# Patient Record
Sex: Female | Born: 1960 | Race: White | Hispanic: No | Marital: Married | Smoking: Current some day smoker
Health system: Southern US, Community
[De-identification: ages and names within clinical notes are randomized; demographics above are authoritative.]

## PROBLEM LIST (undated history)

## (undated) DIAGNOSIS — D500 Iron deficiency anemia secondary to blood loss (chronic): Secondary | ICD-10-CM

## (undated) DIAGNOSIS — M797 Fibromyalgia: Secondary | ICD-10-CM

## (undated) DIAGNOSIS — R5382 Chronic fatigue, unspecified: Secondary | ICD-10-CM

## (undated) DIAGNOSIS — K649 Unspecified hemorrhoids: Secondary | ICD-10-CM

## (undated) HISTORY — PX: BACK SURGERY: SHX140

## (undated) HISTORY — PX: BREAST SURGERY: SHX581

---

## 1999-01-08 ENCOUNTER — Encounter: Admission: RE | Admit: 1999-01-08 | Discharge: 1999-01-08 | Payer: Self-pay | Admitting: Hematology and Oncology

## 1999-02-26 ENCOUNTER — Encounter: Admission: RE | Admit: 1999-02-26 | Discharge: 1999-02-26 | Payer: Self-pay | Admitting: Internal Medicine

## 1999-04-03 ENCOUNTER — Encounter: Admission: RE | Admit: 1999-04-03 | Discharge: 1999-04-03 | Payer: Self-pay | Admitting: Internal Medicine

## 1999-07-01 ENCOUNTER — Encounter: Admission: RE | Admit: 1999-07-01 | Discharge: 1999-07-01 | Payer: Self-pay | Admitting: Hematology and Oncology

## 1999-07-31 ENCOUNTER — Encounter: Admission: RE | Admit: 1999-07-31 | Discharge: 1999-07-31 | Payer: Self-pay | Admitting: Internal Medicine

## 1999-09-09 ENCOUNTER — Encounter: Admission: RE | Admit: 1999-09-09 | Discharge: 1999-09-09 | Payer: Self-pay | Admitting: Obstetrics

## 1999-09-09 ENCOUNTER — Other Ambulatory Visit: Admission: RE | Admit: 1999-09-09 | Discharge: 1999-09-09 | Payer: Self-pay | Admitting: Obstetrics

## 1999-09-13 ENCOUNTER — Emergency Department (HOSPITAL_COMMUNITY): Admission: EM | Admit: 1999-09-13 | Discharge: 1999-09-13 | Payer: Self-pay | Admitting: Emergency Medicine

## 1999-09-26 ENCOUNTER — Encounter: Admission: RE | Admit: 1999-09-26 | Discharge: 1999-12-25 | Payer: Self-pay | Admitting: Sports Medicine

## 2000-04-13 ENCOUNTER — Encounter: Admission: RE | Admit: 2000-04-13 | Discharge: 2000-04-13 | Payer: Self-pay | Admitting: Obstetrics & Gynecology

## 2000-04-15 ENCOUNTER — Ambulatory Visit (HOSPITAL_COMMUNITY): Admission: RE | Admit: 2000-04-15 | Discharge: 2000-04-15 | Payer: Self-pay | Admitting: Obstetrics & Gynecology

## 2000-04-15 ENCOUNTER — Encounter: Payer: Self-pay | Admitting: Obstetrics & Gynecology

## 2000-05-07 ENCOUNTER — Encounter: Admission: RE | Admit: 2000-05-07 | Discharge: 2000-05-07 | Payer: Self-pay | Admitting: Obstetrics & Gynecology

## 2000-05-20 ENCOUNTER — Encounter: Admission: RE | Admit: 2000-05-20 | Discharge: 2000-05-20 | Payer: Self-pay | Admitting: Obstetrics

## 2000-07-19 ENCOUNTER — Encounter: Admission: RE | Admit: 2000-07-19 | Discharge: 2000-07-19 | Payer: Self-pay | Admitting: Internal Medicine

## 2000-08-20 ENCOUNTER — Encounter: Admission: RE | Admit: 2000-08-20 | Discharge: 2000-08-20 | Payer: Self-pay | Admitting: Obstetrics & Gynecology

## 2000-08-24 ENCOUNTER — Encounter: Admission: RE | Admit: 2000-08-24 | Discharge: 2000-08-24 | Payer: Self-pay | Admitting: Obstetrics & Gynecology

## 2000-10-06 ENCOUNTER — Encounter: Admission: RE | Admit: 2000-10-06 | Discharge: 2000-10-06 | Payer: Self-pay | Admitting: Obstetrics & Gynecology

## 2000-10-06 ENCOUNTER — Other Ambulatory Visit: Admission: RE | Admit: 2000-10-06 | Discharge: 2000-10-06 | Payer: Self-pay | Admitting: Obstetrics & Gynecology

## 2000-10-07 ENCOUNTER — Encounter (INDEPENDENT_AMBULATORY_CARE_PROVIDER_SITE_OTHER): Payer: Self-pay

## 2000-10-07 ENCOUNTER — Inpatient Hospital Stay (HOSPITAL_COMMUNITY): Admission: RE | Admit: 2000-10-07 | Discharge: 2000-10-08 | Payer: Self-pay | Admitting: *Deleted

## 2000-10-12 ENCOUNTER — Encounter: Admission: RE | Admit: 2000-10-12 | Discharge: 2000-10-12 | Payer: Self-pay | Admitting: Obstetrics & Gynecology

## 2001-11-01 ENCOUNTER — Encounter: Admission: RE | Admit: 2001-11-01 | Discharge: 2001-11-01 | Payer: Self-pay | Admitting: Internal Medicine

## 2008-11-13 ENCOUNTER — Emergency Department (HOSPITAL_BASED_OUTPATIENT_CLINIC_OR_DEPARTMENT_OTHER): Admission: EM | Admit: 2008-11-13 | Discharge: 2008-11-14 | Payer: Self-pay | Admitting: Emergency Medicine

## 2011-03-12 ENCOUNTER — Emergency Department (INDEPENDENT_AMBULATORY_CARE_PROVIDER_SITE_OTHER): Payer: Medicare Other

## 2011-03-12 ENCOUNTER — Emergency Department (HOSPITAL_BASED_OUTPATIENT_CLINIC_OR_DEPARTMENT_OTHER)
Admission: EM | Admit: 2011-03-12 | Discharge: 2011-03-12 | Disposition: A | Discharge status: Home / Self Care | Payer: Medicare Other | Attending: Emergency Medicine | Admitting: Emergency Medicine

## 2011-03-12 DIAGNOSIS — S2239XA Fracture of one rib, unspecified side, initial encounter for closed fracture: Secondary | ICD-10-CM

## 2011-03-12 DIAGNOSIS — Z79899 Other long term (current) drug therapy: Secondary | ICD-10-CM | POA: Insufficient documentation

## 2011-03-12 DIAGNOSIS — F17200 Nicotine dependence, unspecified, uncomplicated: Secondary | ICD-10-CM | POA: Insufficient documentation

## 2011-03-12 DIAGNOSIS — IMO0001 Reserved for inherently not codable concepts without codable children: Secondary | ICD-10-CM | POA: Insufficient documentation

## 2011-03-12 DIAGNOSIS — IMO0002 Reserved for concepts with insufficient information to code with codable children: Secondary | ICD-10-CM | POA: Insufficient documentation

## 2011-03-12 DIAGNOSIS — R5382 Chronic fatigue, unspecified: Secondary | ICD-10-CM | POA: Insufficient documentation

## 2011-03-12 DIAGNOSIS — I10 Essential (primary) hypertension: Secondary | ICD-10-CM | POA: Insufficient documentation

## 2011-03-12 DIAGNOSIS — M533 Sacrococcygeal disorders, not elsewhere classified: Secondary | ICD-10-CM

## 2011-03-12 DIAGNOSIS — G9332 Myalgic encephalomyelitis/chronic fatigue syndrome: Secondary | ICD-10-CM | POA: Insufficient documentation

## 2011-05-08 NOTE — Discharge Summary (Signed)
[facility]  Patient:    Emily Oliver, Emily Oliver                        MRN: [id number] Adm. Date:  [id number] Disc. Date: [id number] Attending:  Donne Hazel CC:         [facility]   Discharge Summary  HISTORY & PHYSICAL:           Please see the dictated History & Physical examination.  In brief, the patient is a 50 year old, para 2, who presents for a total vaginal hysterectomy for dysfunctional uterine bleeding refractory to medical management.  HOSPITAL COURSE:              The patient underwent a total vaginal hysterectomy.  Please see the dictated operative summary for further details. The patients postoperative course was uneventful, and she is discharged to home on postoperative day #1.  DISCHARGE DIAGNOSIS:          Dysfunction uterine bleeding.  PROCEDURES:                   Total vaginal hysterectomy.  CONDITION UPON DISCHARGE:     Good.  DIET:                         Regular.  ACTIVITY:                     No strenuous activity, no intercourse.  DISCHARGE MEDICATIONS:        Motrin and Percocet.  DISPOSITION:                  The patient is to return to GYN clinic on Tuesday, October 23, for followup. DD:  11/01/00 TD:  11/01/00 Job: [id number]

## 2011-05-08 NOTE — H&P (Signed)
[facility]  Patient:    Emily Oliver, Emily Oliver                          MRN: [id number] Adm. Date:  08/24/00 Attending:  Roseanna Rainbow, M.D.                         History and Physical  CHIEF COMPLAINT:              The patient is a 50 year old, para 2, with dysfunction uterine bleeding refractory to medical management who presents for a total vaginal hysterectomy.  HISTORY OF PRESENT ILLNESS:   The patient has a long history of menorrhagia. There have been several attempts at medical management; i.e. cyclic progestins; however, the patients abnormal uterine bleeding persisted. Workup to this point has included a prolactin and TSH that were within normal limits.  An endometrial biopsy performed in September 2000 demonstrated benign endometrial fragments with features suggestive of exogenous hormonal effects. An ultrasound from April 2001 demonstrated a normal-size uterus.  The left ovary was not identified; the right ovary was consistent with a ruptured follicular cyst.  ALLERGIES:                    CODEINE.  MEDICATIONS:                  Hydrochlorothiazide, Altace, Zoloft, Dexedrine, and Claritin.  PAST OBSTETRICAL/GYNECOLOGIC HISTORY:  She is status post two NSVDs.  She denies any history of any sexually transmitted diseases.  Pap smear from April 2001 demonstrated inflammatory changes.  PAST MEDICAL HISTORY:         ADHD, hypertension, fibromyalgia, allergic rhinitis.  PAST SURGICAL HISTORY:        Breast augmentation.  FAMILY HISTORY:               Hypertension and depression.  SOCIAL HISTORY:               Tobacco, no ethanol or substance abuse.  PHYSICAL EXAMINATION:  VITAL SIGNS:                  Pulse 108, blood pressure 144/94, weight 150.4 pounds, height 5 feet 7 inches.  GENERAL:                      Well-developed, well-nourished, no apparent distress.  HEENT:                        Normocephalic, atraumatic.  NECK:                          No thyromegaly, supple.  LUNGS:                        Clear to auscultation bilaterally.  HEART:                        Regular rate and rhythm.  BREASTS:                      Nontender, no discharge.  ABDOMEN:                      Normoactive bowel sounds throughout, no organomegaly.  PELVIC:  On speculum exam, the vagina is clean.  On bimanual exam, the uterus is small, anteverted, nontender.  The adnexa are nonpalpable and nontender.  Rectovaginal exam is confirmatory.  EXTREMITIES:                  No clubbing, cyanosis, or edema.  ASSESSMENT:                   1. Dysfunction uterine bleeding refractory to                                  medical management.                               2. Pap smear from March 2001 shows inflammatory                                  changes.  PLAN:                         Will repeat the Pap smear.  Will also screen for any bleeding diaphysis; however, this is unlikely.  The patient will be a candidate for a total vaginal hysterectomy. DD:  08/24/00 TD:  08/24/00 Job: [id number]

## 2011-05-08 NOTE — Op Note (Signed)
[facility]  Patient:    Emily Oliver, Emily Oliver                        MRN: [id number] Proc. Date: 10/07/00 Adm. Date:  [id number] Disc. Date: [id number] Attending:  Donne Hazel CC:         [facility]   Operative Report  PREOPERATIVE DIAGNOSIS:       Dysfunctional uterine bleeding.  POSTOPERATIVE DIAGNOSIS:      Dysfunctional uterine bleeding.  OPERATION:                    Total vaginal hysterectomy.  SURGEON:                      Roseanna Rainbow, M.D.  ASSISTANT:                    Donney Rankins, M.D.  ANESTHESIA:                   General endotracheal.  COMPLICATIONS:                None.  ESTIMATED BLOOD LOSS:         100 cc.  URINE OUTPUT:                 200 cc of clear urine at the end of the                               procedure.  FLUIDS:                       500 cc of lactated Ringers.  FINDINGS:                     Exam under anesthesia - anteverted uterus, upper normal limits in size, regular contour.  Operative findings of small 7 x 6, regularly shaped uterus.  Normal tubes and ovaries not well-visualized.  DESCRIPTION OF PROCEDURE:     The risks, benefits, indications, and alternatives of the procedure were reviewed with the patient and informed consent was obtained.  The patient was taken to the operating room with an IV running.  The patient was placed in the dorsolithotomy position, and prepped and draped in the usual sterile fashion.  A weighted speculum was then placed into the vagina and the cervix grasped with a Jacobs tenaculum.  The cervix was then injected anteriorly with 1% lidocaine with 1:200,000 of epinephrine. The cervix was then incised with the scalpel anteriorly and the bladder dissected off the pubovesical cervical fascia anteriorly with Metzenbaum scissors.  The anterior cul-de-sac was then entered sharply.  The same procedure was then performed posteriorly and the  posterior cul-de-sac entered sharply without difficulty.                                At this point, Heaney clamps were placed over the uterosacral ligaments on either side.  These were then transected, and free ties and suture ligatures were placed using 0 chromic.  Hemostasis was assured.  The cardinal ligaments were then clamped on both sides, transected, and secured in a similar fashion.  The uterine arteries were then serially clamped with Heaney clamps, transected.  Free ligatures and  suture ligatures were placed on both sides.  Excellent hemostasis was visualized.                                At this point, the fundus was delivered by applying traction to the fundus with tenaculum and pushing the cervix into the pelvis.  The cornua on both sides were clamped with Heaney clamps, transected, and both free ligatures and suture ligatures were placed.  Excellent hemostasis was noted.  The broad ligament was then serially clamped with Heaney clamps, transected, and both free ligatures and suture ligatures were placed on both sides.  The remainder of the cardinal complex was then clamped with Heaney clamps on both sides and both free ligatures, and suture ligatures were placed, and the uterus and cervix were removed.  The vaginal cuff angles were closed with figure-of-eight sutures of 0 chromic.  The posterior aspect of the vaginal cuff was whip stitched with interrupted figure-of-eight sutures for hemostasis.  The remainder of the vaginal cuff was closed with figure-of-eight sutures of 0 chromic in an interrupted fashion.                                All instruments were then removed from the vagina.  The Foley catheter was placed, and the patient taken out of the dorsolithotomy position, and awakened from general anesthesia.  She was taken to the PACU in stable condition.  Sponge, lap, needle, and instrument counts were correct x 2.  PATHOLOGY:                    Uterus and  cervix. DD:  10/10/00 TD:  10/11/00 Job: [id number]

## 2011-08-06 IMAGING — CR DG RIBS 2V*L*
2 series · 2 of 2 positions shown · non-contrast
Comparison: None.

CLINICAL DATA: Trauma.  Low anterior left sided chest pain times 1
month.

LEFT RIBS - 2 VIEW

[w ribs ap/pa upper left]
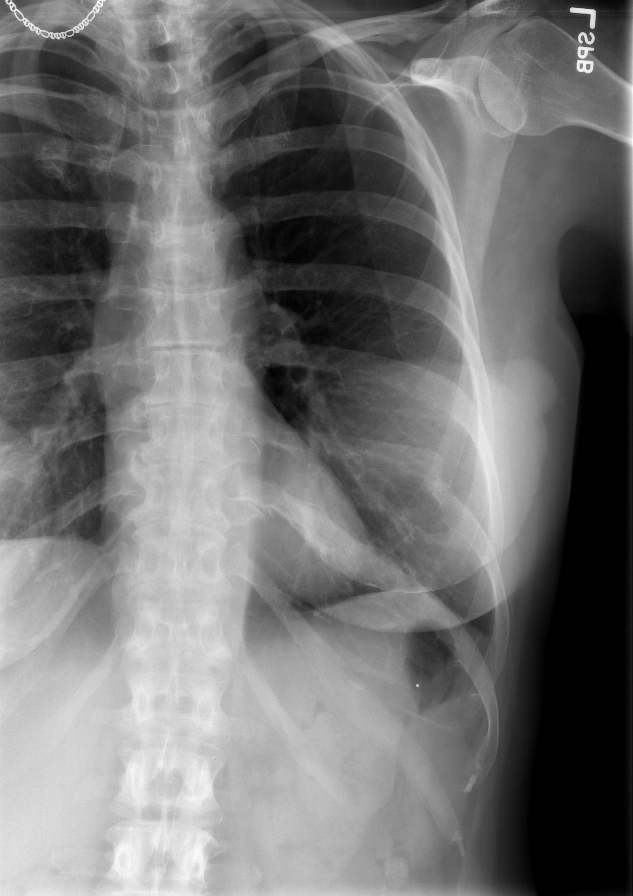

[w ribs ap/pa lower left]
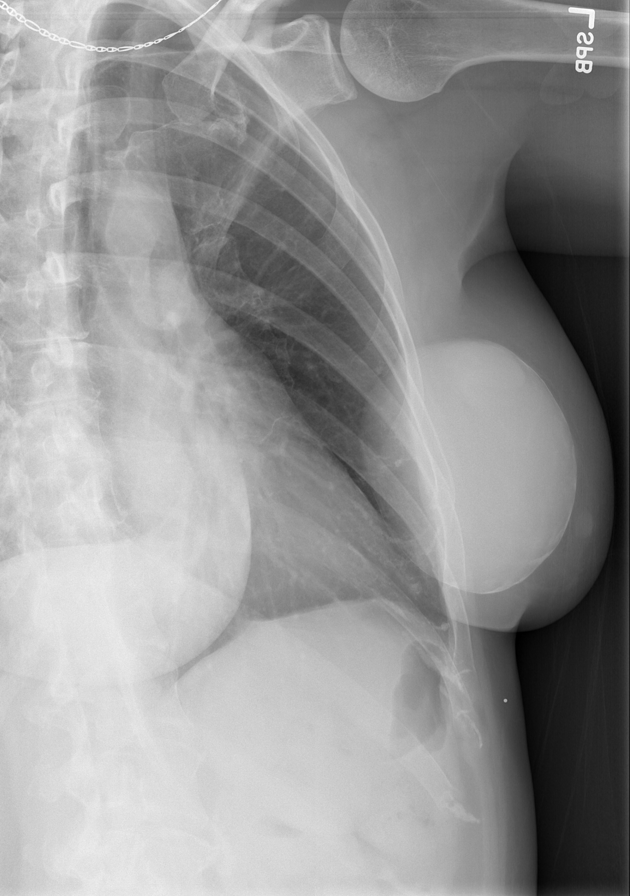

[2 of 2 positions shown; findings below may reference images not displayed]

FINDINGS: Two views of the left ribs are submitted.  A displaced
posterior left ninth rib fracture is evident.  There is no definite
pneumothorax.  A standard chest x-ray would be useful for better
evaluation of pneumothorax.  Heart size is normal.  A breast
implant is noted.
IMPRESSION: 1.  Displaced posterior left ninth rib fracture.
2.  No definite pneumothorax.

## 2018-05-25 ENCOUNTER — Emergency Department (HOSPITAL_BASED_OUTPATIENT_CLINIC_OR_DEPARTMENT_OTHER)
Admission: EM | Admit: 2018-05-25 | Discharge: 2018-05-25 | Disposition: A | Discharge status: Home / Self Care | Payer: BLUE CROSS/BLUE SHIELD | Attending: Emergency Medicine | Admitting: Emergency Medicine

## 2018-05-25 ENCOUNTER — Other Ambulatory Visit: Payer: Self-pay

## 2018-05-25 ENCOUNTER — Encounter (HOSPITAL_BASED_OUTPATIENT_CLINIC_OR_DEPARTMENT_OTHER): Payer: Self-pay

## 2018-05-25 DIAGNOSIS — F17290 Nicotine dependence, other tobacco product, uncomplicated: Secondary | ICD-10-CM | POA: Diagnosis not present

## 2018-05-25 DIAGNOSIS — K625 Hemorrhage of anus and rectum: Secondary | ICD-10-CM | POA: Diagnosis not present

## 2018-05-25 DIAGNOSIS — E876 Hypokalemia: Secondary | ICD-10-CM | POA: Diagnosis not present

## 2018-05-25 DIAGNOSIS — D649 Anemia, unspecified: Secondary | ICD-10-CM | POA: Diagnosis not present

## 2018-05-25 HISTORY — DX: Unspecified hemorrhoids: K64.9

## 2018-05-25 HISTORY — DX: Chronic fatigue, unspecified: R53.82

## 2018-05-25 HISTORY — DX: Iron deficiency anemia secondary to blood loss (chronic): D50.0

## 2018-05-25 HISTORY — DX: Fibromyalgia: M79.7

## 2018-05-25 LAB — CBC WITH DIFFERENTIAL/PLATELET
BASOS ABS: 0.1 10*3/uL (ref 0.0–0.1)
Basophils Relative: 1 %
Eosinophils Absolute: 0.1 10*3/uL (ref 0.0–0.7)
Eosinophils Relative: 1 %
HCT: 25 % — ABNORMAL LOW (ref 36.0–46.0)
Hemoglobin: 7.3 g/dL — ABNORMAL LOW (ref 12.0–15.0)
LYMPHS ABS: 1.5 10*3/uL (ref 0.7–4.0)
Lymphocytes Relative: 16 %
MCH: 24.4 pg — ABNORMAL LOW (ref 26.0–34.0)
MCHC: 29.2 g/dL — AB (ref 30.0–36.0)
MCV: 83.6 fL (ref 78.0–100.0)
Monocytes Absolute: 1.2 10*3/uL — ABNORMAL HIGH (ref 0.1–1.0)
Monocytes Relative: 13 %
NEUTROS ABS: 6.5 10*3/uL (ref 1.7–7.7)
Neutrophils Relative %: 69 %
Platelets: 427 10*3/uL — ABNORMAL HIGH (ref 150–400)
RBC: 2.99 MIL/uL — ABNORMAL LOW (ref 3.87–5.11)
RDW: 22.5 % — AB (ref 11.5–15.5)
WBC: 9.4 10*3/uL (ref 4.0–10.5)

## 2018-05-25 LAB — BASIC METABOLIC PANEL
Anion gap: 10 (ref 5–15)
BUN: 8 mg/dL (ref 6–20)
CALCIUM: 8.9 mg/dL (ref 8.9–10.3)
CO2: 26 mmol/L (ref 22–32)
CREATININE: 0.58 mg/dL (ref 0.44–1.00)
Chloride: 101 mmol/L (ref 101–111)
GFR calc non Af Amer: >60 mL/min (ref >60)
Glucose, Bld: 101 mg/dL — ABNORMAL HIGH (ref 65–99)
Potassium: 2.9 mmol/L — ABNORMAL LOW (ref 3.5–5.1)
SODIUM: 137 mmol/L (ref 135–145)

## 2018-05-25 LAB — OCCULT BLOOD X 1 CARD TO LAB, STOOL: Fecal Occult Bld: POSITIVE — AB

## 2018-05-25 MED ORDER — POTASSIUM CHLORIDE ER 10 MEQ PO TBCR: 40.0000 meq | EXTENDED_RELEASE_TABLET | Freq: Every day | ORAL | 0 refills | Status: AC

## 2018-05-25 MED ORDER — POTASSIUM CHLORIDE CRYS ER 20 MEQ PO TBCR
40.0000 meq | EXTENDED_RELEASE_TABLET | Freq: Once | ORAL | Status: AC
  Administered 2018-05-25: 40 meq via ORAL
  Filled 2018-05-25: qty 2

## 2018-05-25 NOTE — ED Notes (Signed)
Pt understood dc material. NAD noted. Script given at dc  

## 2018-05-25 NOTE — ED Triage Notes (Signed)
Pt c/o rectal bleeding for the last four years, worse over the last month with dizziness, states her endoscopy doctor wanted her to come here and have blood work

## 2018-05-25 NOTE — Discharge Instructions (Addendum)
You were seen in the emergency department today for rectal bleeding and lightheadedness.  Your hemoglobin was 7.3- please continue to take your iron supplement pills and have this rechecked by your primary care provider in the next 1 to 2 days.  Additionally your potassium was low at 2.9, we have prescribed you potassium supplement please take this to ensure your potassium is replaced.  As discussed above follow-up with your primary care provider in the next 1 to 2 days for reevaluation.  Return to the ER immediately for new or worsening symptoms including but not limited to increased lightheadedness, passing out, chest pain, trouble breathing, or any other concerns that you may have.

## 2018-05-25 NOTE — ED Provider Notes (Signed)
[facility] EMERGENCY DEPARTMENT Provider Note   CSN: [id number] Arrival date & time: 05/25/18  1848     History   Chief Complaint Chief Complaint  Patient presents with  . Rectal Bleeding    HPI Emily Oliver is a 57 y.o. female with a hx of tobacco abuse, fibromyalgia, hemorrhoids, and iron deficiency anemia due to chronic blood loss who presents to the ED for increased rectal bleeding and intermittent lightheadedness over past 2 days. Patient states she has had rectal bleeding for the past 4 years- this has been worked up in the past. She had an endoscopy, colonoscopy, and capsule endoscopy 6 months ago- only abnormalities have been internal hemorrhoids. She takes an iron supplement. She states that she is followed by [facility], was seen at the beginning of May 2019 and had an hgb in the 7s. Patient states over the past 2 days she has had some increased bleeding, however this seemed to stop today. She gets abdominal pain prior to a BM, otherwise none. She states yesterday she felt lightheaded when transitioning from sitting to standing a few times, this has happened once today. Other than transitioning positions she is asymptomatic. No other specific alleviating/aggravating factors. Denies syncope, dyspnea, chest pain, dizziness like room spinning, nausea, vomiting, diarrhea, or melena. She called the gastroenterology group who will be doing a repeat endoscopy and they recommend she come to the ER for labs to be checked.    HPI  Past Medical History:  Diagnosis Date  . Chronic fatigue   . Fibromyalgia   . Hemorrhoids   . Iron deficiency anemia due to chronic blood loss     There are no active problems to display for this patient.   Past Surgical History:  Procedure Laterality Date  . BACK SURGERY     multiple  . BREAST SURGERY     implant     OB History   None      Home Medications    Prior to Admission medications   Not on File     Family History No family history on file.  Social History Social History   Tobacco Use  . Smoking status: Current Some Day Smoker    Types: E-cigarettes  . Smokeless tobacco: Never Used  Substance Use Topics  . Alcohol use: Not Currently    Comment: occasion  . Drug use: Not on file     Allergies   Patient has no known allergies.   Review of Systems Review of Systems  Constitutional: Negative for chills and fever.  Respiratory: Negative for shortness of breath.   Cardiovascular: Negative for chest pain.  Gastrointestinal: Positive for abdominal pain (just prior to BM, otherwise none) and blood in stool. Negative for constipation, diarrhea, nausea and vomiting.  Neurological: Positive for light-headedness (only with transitioning from sitting to standing). Negative for seizures, syncope, weakness and numbness.  All other systems reviewed and are negative.  Physical Exam Updated Vital Signs BP 118/81 (BP Location: Left Arm)   Pulse (!) 103   Temp 98.7 F (37.1 C) (Oral)   Resp 20   Ht 5\' 7"  (1.702 m)   Wt 67.1 kg (148 lb)   SpO2 100%   BMI 23.18 kg/m   Physical Exam  Constitutional: She appears well-developed and well-nourished. No distress.  HENT:  Head: Normocephalic and atraumatic.  Eyes: Conjunctivae are normal. Right eye exhibits no discharge. Left eye exhibits no discharge.  Cardiovascular: Regular rhythm. Tachycardia present.  No murmur heard. Pulmonary/Chest: Breath sounds normal. No respiratory distress. She has no wheezes. She has no rales.  Abdominal: Soft. Normal appearance. She exhibits no distension. There is no tenderness. There is no rebound and no guarding.  Genitourinary: Rectal exam shows external hemorrhoid (multiple, nontender, nonthrombosed) and guaiac positive stool (positive). Rectal exam shows no tenderness. Pelvic exam was performed with patient in the knee-chest position.  Genitourinary Comments: RN Susy FrizzleMatt present as chaperone Soft  brown stool on exam, no gross melena.   Neurological: She is alert.  Clear speech.   Skin: Skin is warm and dry. No rash noted.  Psychiatric: She has a normal mood and affect. Her behavior is normal.  Nursing note and vitals reviewed.  ED Treatments / Results  Labs Results for orders placed or performed during the hospital encounter of 05/25/18  CBC with Differential  Result Value Ref Range   WBC 9.4 4.0 - 10.5 K/uL   RBC 2.99 (L) 3.87 - 5.11 MIL/uL   Hemoglobin 7.3 (L) 12.0 - 15.0 g/dL   HCT 82.925.0 (L) 56.236.0 - 13.046.0 %   MCV 83.6 78.0 - 100.0 fL   MCH 24.4 (L) 26.0 - 34.0 pg   MCHC 29.2 (L) 30.0 - 36.0 g/dL   RDW 86.522.5 (H) 78.411.5 - 69.615.5 %   Platelets 427 (H) 150 - 400 K/uL   Neutrophils Relative % 69 %   Lymphocytes Relative 16 %   Monocytes Relative 13 %   Eosinophils Relative 1 %   Basophils Relative 1 %   Neutro Abs 6.5 1.7 - 7.7 K/uL   Lymphs Abs 1.5 0.7 - 4.0 K/uL   Monocytes Absolute 1.2 (H) 0.1 - 1.0 K/uL   Eosinophils Absolute 0.1 0.0 - 0.7 K/uL   Basophils Absolute 0.1 0.0 - 0.1 K/uL   RBC Morphology POLYCHROMASIA PRESENT   Basic metabolic panel  Result Value Ref Range   Sodium 137 135 - 145 mmol/L   Potassium 2.9 (L) 3.5 - 5.1 mmol/L   Chloride 101 101 - 111 mmol/L   CO2 26 22 - 32 mmol/L   Glucose, Bld 101 (H) 65 - 99 mg/dL   BUN 8 6 - 20 mg/dL   Creatinine, Ser 2.950.58 0.44 - 1.00 mg/dL   Calcium 8.9 8.9 - 28.410.3 mg/dL   GFR calc non Af Amer >60 >60 mL/min   GFR calc Af Amer >60 >60 mL/min   Anion gap 10 5 - 15  Occult blood card to lab, stool Provider will collect  Result Value Ref Range   Fecal Occult Bld POSITIVE (A) NEGATIVE   No results found.  EKG None  Radiology No results found.  Procedures Procedures (including critical care time)  Medications Ordered in ED Medications - No data to display   Initial Impression / Assessment and Plan / ED Course  I have reviewed the triage vital signs and the nursing notes.  Pertinent labs & imaging results  that were available during my care of the patient were reviewed by me and considered in my medical decision making (see chart for details).   Patient presents with rectal bleeding and lightheadedness.  Patient nontoxic-appearing, no apparent distress, vitals WNL with the exception of mild tachycardia.  Benign physical exam.  Rectal exam performed consistent with external hemorrhoids which appear nonthrombosed, there is no gross melena on exam.  Labs reviewed notable for hypokalemia at 2.9 and microcytic anemia with hgb 7.3- patient states this was what her hgb was at her recent PCP visit at  the beginning of may of this year- about 1 month ago, she also reports she has potassium supplement at home because this has been low in the past but she has not been taking it recently because she thought this improved. She states the lightheadedness has pretty much resolved, only occurred once earlier today. She denies dyspnea, chest pain, or syncope. I personally ambulated the patient and her SpO2 remained > 92%, HR 110-120, patient without complaints during ambulation states she feels at baseline and wants to go home. Anemia appears at baseline per patient (unavailable on chart review), she states she has no complaints at this time. Discussed findings and plan of care with supervising physician Dr. Anitra Lauth- will have patient continue iron supplement, give potassium supplementation, and have her follow up with PCP closely with strict return precautions. I discussed results, treatment plan, need for PCP follow-up, and return precautions with the patient. Provided opportunity for questions, patient confirmed understanding and is in agreement with plan.   Final Clinical Impressions(s) / ED Diagnoses   Final diagnoses:  Rectal bleeding  Anemia, unspecified type  Hypokalemia    ED Discharge Orders        Ordered    potassium chloride (K-DUR) 10 MEQ tablet  Daily     05/25/18 2109       Cherly Anderson,  PA-C 05/26/18 0001    Gwyneth Sprout, MD 05/28/18 2340

## 2023-05-19 ENCOUNTER — Inpatient Hospital Stay
Admit: 2023-05-19 | Discharge: 2023-06-05 | Disposition: A | Discharge status: Home / Self Care | Payer: Managed Care, Other (non HMO) | Source: Other Acute Inpatient Hospital

## 2023-05-21 LAB — CBC
HCT: 32.9 % — ABNORMAL LOW (ref 36.0–46.0)
Hemoglobin: 9.3 g/dL — ABNORMAL LOW (ref 12.0–15.0)
MCH: 22.3 pg — ABNORMAL LOW (ref 26.0–34.0)
MCHC: 28.3 g/dL — ABNORMAL LOW (ref 30.0–36.0)
MCV: 78.9 fL — ABNORMAL LOW (ref 80.0–100.0)
Platelets: 179 10*3/uL (ref 150–400)
RBC: 4.17 MIL/uL (ref 3.87–5.11)
RDW: 20 % — ABNORMAL HIGH (ref 11.5–15.5)
WBC: 5.9 10*3/uL (ref 4.0–10.5)
nRBC: 0 % (ref 0.0–0.2)

## 2023-05-21 LAB — BASIC METABOLIC PANEL
Anion gap: 8 (ref 5–15)
BUN: 10 mg/dL (ref 8–23)
CO2: 30 mmol/L (ref 22–32)
Calcium: 8.7 mg/dL — ABNORMAL LOW (ref 8.9–10.3)
Chloride: 95 mmol/L — ABNORMAL LOW (ref 98–111)
Creatinine, Ser: 0.5 mg/dL (ref 0.44–1.00)
GFR, Estimated: >60 mL/min (ref >60)
Glucose, Bld: 101 mg/dL — ABNORMAL HIGH (ref 70–99)
Potassium: 3.5 mmol/L (ref 3.5–5.1)
Sodium: 133 mmol/L — ABNORMAL LOW (ref 135–145)

## 2023-05-21 LAB — MAGNESIUM: Magnesium: 1.8 mg/dL (ref 1.7–2.4)

## 2023-05-22 ENCOUNTER — Other Ambulatory Visit (HOSPITAL_COMMUNITY): Payer: Managed Care, Other (non HMO)

## 2023-05-22 DIAGNOSIS — J9621 Acute and chronic respiratory failure with hypoxia: Secondary | ICD-10-CM | POA: Diagnosis not present

## 2023-05-22 DIAGNOSIS — J449 Chronic obstructive pulmonary disease, unspecified: Secondary | ICD-10-CM | POA: Diagnosis not present

## 2023-05-22 DIAGNOSIS — I2721 Secondary pulmonary arterial hypertension: Secondary | ICD-10-CM | POA: Diagnosis not present

## 2023-05-22 DIAGNOSIS — J849 Interstitial pulmonary disease, unspecified: Secondary | ICD-10-CM | POA: Diagnosis not present

## 2023-05-22 LAB — BLOOD GAS, ARTERIAL
Acid-Base Excess: 9.9 mmol/L — ABNORMAL HIGH (ref 0.0–2.0)
Bicarbonate: 36 mmol/L — ABNORMAL HIGH (ref 20.0–28.0)
O2 Saturation: 99.3 %
Patient temperature: 37
pCO2 arterial: 53 mmHg — ABNORMAL HIGH (ref 32–48)
pH, Arterial: 7.44 (ref 7.35–7.45)
pO2, Arterial: 122 mmHg — ABNORMAL HIGH (ref 83–108)

## 2023-05-23 ENCOUNTER — Other Ambulatory Visit (HOSPITAL_COMMUNITY): Payer: Managed Care, Other (non HMO)

## 2023-05-23 DIAGNOSIS — J449 Chronic obstructive pulmonary disease, unspecified: Secondary | ICD-10-CM | POA: Diagnosis not present

## 2023-05-23 DIAGNOSIS — J849 Interstitial pulmonary disease, unspecified: Secondary | ICD-10-CM | POA: Diagnosis not present

## 2023-05-23 DIAGNOSIS — I2721 Secondary pulmonary arterial hypertension: Secondary | ICD-10-CM | POA: Diagnosis not present

## 2023-05-23 DIAGNOSIS — J9621 Acute and chronic respiratory failure with hypoxia: Secondary | ICD-10-CM | POA: Diagnosis not present

## 2023-05-23 LAB — BASIC METABOLIC PANEL
Anion gap: 12 (ref 5–15)
BUN: 11 mg/dL (ref 8–23)
CO2: 30 mmol/L (ref 22–32)
Calcium: 9 mg/dL (ref 8.9–10.3)
Chloride: 94 mmol/L — ABNORMAL LOW (ref 98–111)
Creatinine, Ser: 0.46 mg/dL (ref 0.44–1.00)
GFR, Estimated: >60 mL/min (ref >60)
Glucose, Bld: 136 mg/dL — ABNORMAL HIGH (ref 70–99)
Potassium: 4 mmol/L (ref 3.5–5.1)
Sodium: 136 mmol/L (ref 135–145)

## 2023-05-24 ENCOUNTER — Other Ambulatory Visit (HOSPITAL_COMMUNITY): Payer: Managed Care, Other (non HMO)

## 2023-05-24 DIAGNOSIS — I2721 Secondary pulmonary arterial hypertension: Secondary | ICD-10-CM

## 2023-05-24 DIAGNOSIS — J849 Interstitial pulmonary disease, unspecified: Secondary | ICD-10-CM

## 2023-05-24 DIAGNOSIS — J449 Chronic obstructive pulmonary disease, unspecified: Secondary | ICD-10-CM

## 2023-05-24 DIAGNOSIS — J9621 Acute and chronic respiratory failure with hypoxia: Secondary | ICD-10-CM

## 2023-05-25 DIAGNOSIS — I2721 Secondary pulmonary arterial hypertension: Secondary | ICD-10-CM | POA: Diagnosis not present

## 2023-05-25 DIAGNOSIS — J849 Interstitial pulmonary disease, unspecified: Secondary | ICD-10-CM | POA: Diagnosis not present

## 2023-05-25 DIAGNOSIS — J9621 Acute and chronic respiratory failure with hypoxia: Secondary | ICD-10-CM | POA: Diagnosis not present

## 2023-05-25 DIAGNOSIS — J449 Chronic obstructive pulmonary disease, unspecified: Secondary | ICD-10-CM | POA: Diagnosis not present

## 2023-05-26 DIAGNOSIS — I2721 Secondary pulmonary arterial hypertension: Secondary | ICD-10-CM

## 2023-05-26 DIAGNOSIS — J449 Chronic obstructive pulmonary disease, unspecified: Secondary | ICD-10-CM

## 2023-05-26 DIAGNOSIS — J849 Interstitial pulmonary disease, unspecified: Secondary | ICD-10-CM

## 2023-05-26 DIAGNOSIS — J9621 Acute and chronic respiratory failure with hypoxia: Secondary | ICD-10-CM

## 2023-05-27 ENCOUNTER — Other Ambulatory Visit (HOSPITAL_COMMUNITY): Payer: Managed Care, Other (non HMO)

## 2023-05-27 DIAGNOSIS — J849 Interstitial pulmonary disease, unspecified: Secondary | ICD-10-CM

## 2023-05-27 DIAGNOSIS — I2721 Secondary pulmonary arterial hypertension: Secondary | ICD-10-CM

## 2023-05-27 DIAGNOSIS — J9621 Acute and chronic respiratory failure with hypoxia: Secondary | ICD-10-CM

## 2023-05-27 DIAGNOSIS — J449 Chronic obstructive pulmonary disease, unspecified: Secondary | ICD-10-CM

## 2023-05-27 LAB — BASIC METABOLIC PANEL
Anion gap: 12 (ref 5–15)
BUN: 10 mg/dL (ref 8–23)
CO2: 30 mmol/L (ref 22–32)
Calcium: 8.8 mg/dL — ABNORMAL LOW (ref 8.9–10.3)
Chloride: 94 mmol/L — ABNORMAL LOW (ref 98–111)
Creatinine, Ser: 0.51 mg/dL (ref 0.44–1.00)
GFR, Estimated: >60 mL/min (ref >60)
Glucose, Bld: 213 mg/dL — ABNORMAL HIGH (ref 70–99)
Potassium: 2.8 mmol/L — ABNORMAL LOW (ref 3.5–5.1)
Sodium: 136 mmol/L (ref 135–145)

## 2023-05-27 LAB — CBC WITH DIFFERENTIAL/PLATELET
Abs Immature Granulocytes: 0.03 10*3/uL (ref 0.00–0.07)
Basophils Absolute: 0 10*3/uL (ref 0.0–0.1)
Basophils Relative: 0 %
Eosinophils Absolute: 0 10*3/uL (ref 0.0–0.5)
Eosinophils Relative: 0 %
HCT: 31.4 % — ABNORMAL LOW (ref 36.0–46.0)
Hemoglobin: 8.9 g/dL — ABNORMAL LOW (ref 12.0–15.0)
Immature Granulocytes: 0 %
Lymphocytes Relative: 6 %
Lymphs Abs: 0.5 10*3/uL — ABNORMAL LOW (ref 0.7–4.0)
MCH: 22.8 pg — ABNORMAL LOW (ref 26.0–34.0)
MCHC: 28.3 g/dL — ABNORMAL LOW (ref 30.0–36.0)
MCV: 80.5 fL (ref 80.0–100.0)
Monocytes Absolute: 0.4 10*3/uL (ref 0.1–1.0)
Monocytes Relative: 5 %
Neutro Abs: 6.5 10*3/uL (ref 1.7–7.7)
Neutrophils Relative %: 89 %
Platelets: 210 10*3/uL (ref 150–400)
RBC: 3.9 MIL/uL (ref 3.87–5.11)
RDW: 19.5 % — ABNORMAL HIGH (ref 11.5–15.5)
WBC: 7.5 10*3/uL (ref 4.0–10.5)
nRBC: 0 % (ref 0.0–0.2)

## 2023-05-27 LAB — MAGNESIUM
Magnesium: 1.4 mg/dL — ABNORMAL LOW (ref 1.7–2.4)
Magnesium: 2.1 mg/dL (ref 1.7–2.4)

## 2023-05-27 LAB — PHOSPHORUS: Phosphorus: 2.6 mg/dL (ref 2.5–4.6)

## 2023-05-27 LAB — POTASSIUM: Potassium: 4.3 mmol/L (ref 3.5–5.1)

## 2023-05-28 DIAGNOSIS — I2721 Secondary pulmonary arterial hypertension: Secondary | ICD-10-CM | POA: Diagnosis not present

## 2023-05-28 DIAGNOSIS — J9621 Acute and chronic respiratory failure with hypoxia: Secondary | ICD-10-CM | POA: Diagnosis not present

## 2023-05-28 DIAGNOSIS — J849 Interstitial pulmonary disease, unspecified: Secondary | ICD-10-CM | POA: Diagnosis not present

## 2023-05-28 DIAGNOSIS — J449 Chronic obstructive pulmonary disease, unspecified: Secondary | ICD-10-CM | POA: Diagnosis not present

## 2023-05-28 LAB — BASIC METABOLIC PANEL
Anion gap: 8 (ref 5–15)
BUN: 17 mg/dL (ref 8–23)
CO2: 30 mmol/L (ref 22–32)
Calcium: 8.6 mg/dL — ABNORMAL LOW (ref 8.9–10.3)
Chloride: 98 mmol/L (ref 98–111)
Creatinine, Ser: 0.45 mg/dL (ref 0.44–1.00)
GFR, Estimated: >60 mL/min (ref >60)
Glucose, Bld: 97 mg/dL (ref 70–99)
Potassium: 3.8 mmol/L (ref 3.5–5.1)
Sodium: 136 mmol/L (ref 135–145)

## 2023-05-28 LAB — MAGNESIUM: Magnesium: 2.1 mg/dL (ref 1.7–2.4)

## 2023-05-29 DIAGNOSIS — J849 Interstitial pulmonary disease, unspecified: Secondary | ICD-10-CM | POA: Diagnosis not present

## 2023-05-29 DIAGNOSIS — J9621 Acute and chronic respiratory failure with hypoxia: Secondary | ICD-10-CM | POA: Diagnosis not present

## 2023-05-29 DIAGNOSIS — J449 Chronic obstructive pulmonary disease, unspecified: Secondary | ICD-10-CM | POA: Diagnosis not present

## 2023-05-29 DIAGNOSIS — I2721 Secondary pulmonary arterial hypertension: Secondary | ICD-10-CM | POA: Diagnosis not present

## 2023-05-29 LAB — CBC WITH DIFFERENTIAL/PLATELET
Abs Immature Granulocytes: 0.04 10*3/uL (ref 0.00–0.07)
Basophils Absolute: 0 10*3/uL (ref 0.0–0.1)
Basophils Relative: 0 %
Eosinophils Absolute: 0.1 10*3/uL (ref 0.0–0.5)
Eosinophils Relative: 1 %
HCT: 33.7 % — ABNORMAL LOW (ref 36.0–46.0)
Hemoglobin: 9.3 g/dL — ABNORMAL LOW (ref 12.0–15.0)
Immature Granulocytes: 0 %
Lymphocytes Relative: 7 %
Lymphs Abs: 0.7 10*3/uL (ref 0.7–4.0)
MCH: 22 pg — ABNORMAL LOW (ref 26.0–34.0)
MCHC: 27.6 g/dL — ABNORMAL LOW (ref 30.0–36.0)
MCV: 79.7 fL — ABNORMAL LOW (ref 80.0–100.0)
Monocytes Absolute: 0.7 10*3/uL (ref 0.1–1.0)
Monocytes Relative: 7 %
Neutro Abs: 7.9 10*3/uL — ABNORMAL HIGH (ref 1.7–7.7)
Neutrophils Relative %: 85 %
Platelets: 246 10*3/uL (ref 150–400)
RBC: 4.23 MIL/uL (ref 3.87–5.11)
RDW: 19.1 % — ABNORMAL HIGH (ref 11.5–15.5)
WBC: 9.4 10*3/uL (ref 4.0–10.5)
nRBC: 0 % (ref 0.0–0.2)

## 2023-05-29 LAB — BASIC METABOLIC PANEL
Anion gap: 13 (ref 5–15)
BUN: 17 mg/dL (ref 8–23)
CO2: 31 mmol/L (ref 22–32)
Calcium: 9.5 mg/dL (ref 8.9–10.3)
Chloride: 93 mmol/L — ABNORMAL LOW (ref 98–111)
Creatinine, Ser: 0.54 mg/dL (ref 0.44–1.00)
GFR, Estimated: >60 mL/min (ref >60)
Glucose, Bld: 122 mg/dL — ABNORMAL HIGH (ref 70–99)
Potassium: 4.4 mmol/L (ref 3.5–5.1)
Sodium: 137 mmol/L (ref 135–145)

## 2023-05-29 LAB — MAGNESIUM: Magnesium: 2 mg/dL (ref 1.7–2.4)

## 2023-05-29 LAB — PHOSPHORUS: Phosphorus: 4.3 mg/dL (ref 2.5–4.6)

## 2023-05-30 DIAGNOSIS — J849 Interstitial pulmonary disease, unspecified: Secondary | ICD-10-CM

## 2023-05-30 DIAGNOSIS — J449 Chronic obstructive pulmonary disease, unspecified: Secondary | ICD-10-CM

## 2023-05-30 DIAGNOSIS — J9621 Acute and chronic respiratory failure with hypoxia: Secondary | ICD-10-CM

## 2023-05-30 DIAGNOSIS — I2721 Secondary pulmonary arterial hypertension: Secondary | ICD-10-CM

## 2023-05-31 DIAGNOSIS — J9621 Acute and chronic respiratory failure with hypoxia: Secondary | ICD-10-CM | POA: Diagnosis not present

## 2023-05-31 DIAGNOSIS — J449 Chronic obstructive pulmonary disease, unspecified: Secondary | ICD-10-CM | POA: Diagnosis not present

## 2023-05-31 DIAGNOSIS — J849 Interstitial pulmonary disease, unspecified: Secondary | ICD-10-CM | POA: Diagnosis not present

## 2023-05-31 DIAGNOSIS — I2721 Secondary pulmonary arterial hypertension: Secondary | ICD-10-CM | POA: Diagnosis not present

## 2023-05-31 LAB — CBC WITH DIFFERENTIAL/PLATELET
Abs Immature Granulocytes: 0.07 10*3/uL (ref 0.00–0.07)
Basophils Absolute: 0 10*3/uL (ref 0.0–0.1)
Basophils Relative: 0 %
Eosinophils Absolute: 0 10*3/uL (ref 0.0–0.5)
Eosinophils Relative: 0 %
HCT: 33.1 % — ABNORMAL LOW (ref 36.0–46.0)
Hemoglobin: 9.4 g/dL — ABNORMAL LOW (ref 12.0–15.0)
Immature Granulocytes: 1 %
Lymphocytes Relative: 13 %
Lymphs Abs: 1.4 10*3/uL (ref 0.7–4.0)
MCH: 22.7 pg — ABNORMAL LOW (ref 26.0–34.0)
MCHC: 28.4 g/dL — ABNORMAL LOW (ref 30.0–36.0)
MCV: 79.8 fL — ABNORMAL LOW (ref 80.0–100.0)
Monocytes Absolute: 0.9 10*3/uL (ref 0.1–1.0)
Monocytes Relative: 8 %
Neutro Abs: 8.9 10*3/uL — ABNORMAL HIGH (ref 1.7–7.7)
Neutrophils Relative %: 78 %
Platelets: 284 10*3/uL (ref 150–400)
RBC: 4.15 MIL/uL (ref 3.87–5.11)
RDW: 18.9 % — ABNORMAL HIGH (ref 11.5–15.5)
WBC: 11.3 10*3/uL — ABNORMAL HIGH (ref 4.0–10.5)
nRBC: 0 % (ref 0.0–0.2)

## 2023-05-31 LAB — BASIC METABOLIC PANEL
Anion gap: 10 (ref 5–15)
BUN: 19 mg/dL (ref 8–23)
CO2: 31 mmol/L (ref 22–32)
Calcium: 8.9 mg/dL (ref 8.9–10.3)
Chloride: 93 mmol/L — ABNORMAL LOW (ref 98–111)
Creatinine, Ser: 0.51 mg/dL (ref 0.44–1.00)
GFR, Estimated: >60 mL/min (ref >60)
Glucose, Bld: 110 mg/dL — ABNORMAL HIGH (ref 70–99)
Potassium: 4.1 mmol/L (ref 3.5–5.1)
Sodium: 134 mmol/L — ABNORMAL LOW (ref 135–145)

## 2023-05-31 LAB — PHOSPHORUS: Phosphorus: 4.7 mg/dL — ABNORMAL HIGH (ref 2.5–4.6)

## 2023-05-31 LAB — MAGNESIUM: Magnesium: 2 mg/dL (ref 1.7–2.4)

## 2023-06-01 DIAGNOSIS — I2721 Secondary pulmonary arterial hypertension: Secondary | ICD-10-CM | POA: Diagnosis not present

## 2023-06-01 DIAGNOSIS — J9621 Acute and chronic respiratory failure with hypoxia: Secondary | ICD-10-CM | POA: Diagnosis not present

## 2023-06-01 DIAGNOSIS — J449 Chronic obstructive pulmonary disease, unspecified: Secondary | ICD-10-CM | POA: Diagnosis not present

## 2023-06-01 DIAGNOSIS — J849 Interstitial pulmonary disease, unspecified: Secondary | ICD-10-CM | POA: Diagnosis not present

## 2023-06-02 DIAGNOSIS — J849 Interstitial pulmonary disease, unspecified: Secondary | ICD-10-CM | POA: Diagnosis not present

## 2023-06-02 DIAGNOSIS — J9621 Acute and chronic respiratory failure with hypoxia: Secondary | ICD-10-CM | POA: Diagnosis not present

## 2023-06-02 DIAGNOSIS — I2721 Secondary pulmonary arterial hypertension: Secondary | ICD-10-CM | POA: Diagnosis not present

## 2023-06-02 DIAGNOSIS — J449 Chronic obstructive pulmonary disease, unspecified: Secondary | ICD-10-CM | POA: Diagnosis not present

## 2023-06-03 DIAGNOSIS — J9621 Acute and chronic respiratory failure with hypoxia: Secondary | ICD-10-CM | POA: Diagnosis not present

## 2023-06-03 DIAGNOSIS — J849 Interstitial pulmonary disease, unspecified: Secondary | ICD-10-CM | POA: Diagnosis not present

## 2023-06-03 DIAGNOSIS — I2721 Secondary pulmonary arterial hypertension: Secondary | ICD-10-CM | POA: Diagnosis not present

## 2023-06-03 DIAGNOSIS — J449 Chronic obstructive pulmonary disease, unspecified: Secondary | ICD-10-CM | POA: Diagnosis not present

## 2023-06-04 DIAGNOSIS — J9621 Acute and chronic respiratory failure with hypoxia: Secondary | ICD-10-CM | POA: Diagnosis not present

## 2023-06-04 DIAGNOSIS — J849 Interstitial pulmonary disease, unspecified: Secondary | ICD-10-CM | POA: Diagnosis not present

## 2023-06-04 DIAGNOSIS — J449 Chronic obstructive pulmonary disease, unspecified: Secondary | ICD-10-CM | POA: Diagnosis not present

## 2023-06-04 DIAGNOSIS — I2721 Secondary pulmonary arterial hypertension: Secondary | ICD-10-CM | POA: Diagnosis not present

## 2023-06-05 DIAGNOSIS — J9621 Acute and chronic respiratory failure with hypoxia: Secondary | ICD-10-CM | POA: Diagnosis not present

## 2023-06-05 DIAGNOSIS — J449 Chronic obstructive pulmonary disease, unspecified: Secondary | ICD-10-CM | POA: Diagnosis not present

## 2023-06-05 DIAGNOSIS — J849 Interstitial pulmonary disease, unspecified: Secondary | ICD-10-CM | POA: Diagnosis not present

## 2023-06-05 DIAGNOSIS — I2721 Secondary pulmonary arterial hypertension: Secondary | ICD-10-CM | POA: Diagnosis not present

## 2024-03-07 ENCOUNTER — Emergency Department (HOSPITAL_BASED_OUTPATIENT_CLINIC_OR_DEPARTMENT_OTHER)
Admission: EM | Admit: 2024-03-07 | Discharge: 2024-03-07 | Disposition: A | Discharge status: Home / Self Care | Attending: Emergency Medicine | Admitting: Emergency Medicine

## 2024-03-07 ENCOUNTER — Other Ambulatory Visit: Payer: Self-pay

## 2024-03-07 ENCOUNTER — Encounter (HOSPITAL_BASED_OUTPATIENT_CLINIC_OR_DEPARTMENT_OTHER): Payer: Self-pay | Admitting: Emergency Medicine

## 2024-03-07 DIAGNOSIS — N611 Abscess of the breast and nipple: Secondary | ICD-10-CM | POA: Diagnosis present

## 2024-03-07 DIAGNOSIS — L0390 Cellulitis, unspecified: Secondary | ICD-10-CM | POA: Diagnosis not present

## 2024-03-07 MED ORDER — DOXYCYCLINE HYCLATE 100 MG PO TABS
100.0000 mg | ORAL_TABLET | Freq: Once | ORAL | Status: AC
  Administered 2024-03-07: 100 mg via ORAL
  Filled 2024-03-07: qty 1

## 2024-03-07 MED ORDER — CEPHALEXIN 250 MG PO CAPS
500.0000 mg | ORAL_CAPSULE | Freq: Once | ORAL | Status: AC
  Administered 2024-03-07: 500 mg via ORAL
  Filled 2024-03-07: qty 2

## 2024-03-07 MED ORDER — CEPHALEXIN 500 MG PO CAPS: 500.0000 mg | ORAL_CAPSULE | Freq: Four times a day (QID) | ORAL | 0 refills | Status: AC

## 2024-03-07 MED ORDER — DOXYCYCLINE HYCLATE 100 MG PO CAPS: 100.0000 mg | ORAL_CAPSULE | Freq: Two times a day (BID) | ORAL | 0 refills | Status: AC

## 2024-03-07 NOTE — ED Provider Notes (Signed)
 [facility] Provider Note   CSN: [id number] Arrival date & time: 03/07/24  1911     History Chief Complaint  Patient presents with   Abscess    HPI Emily Oliver is a 63 y.o. female presenting for chief complaint of breast redness and swelling. States that she has been having drainage from her implant surgical site from 7 years ago.  States that it is painless but erythematous and quite uncomfortable. She notices central bladder redness.  She told her husband to remove the implant which was performed at home today.  Skin is quite erythematous with purulent drainage from the now open incision. She is ambulatory tolerating p.o. intake in no acute distress. No signs systemic illness.  Patient denies fevers chills nausea vomiting syncope shortness of breath..   Patient's recorded medical, surgical, social, medication list and allergies were reviewed in the Snapshot window as part of the initial history.   Review of Systems   Review of Systems  Constitutional:  Negative for chills and fever.  HENT:  Negative for ear pain and sore throat.   Eyes:  Negative for pain and visual disturbance.  Respiratory:  Negative for cough and shortness of breath.   Cardiovascular:  Negative for chest pain and palpitations.  Gastrointestinal:  Negative for abdominal pain and vomiting.  Genitourinary:  Negative for dysuria and hematuria.  Musculoskeletal:  Negative for arthralgias and back pain.  Skin:  Positive for wound. Negative for color change and rash.  Neurological:  Negative for seizures and syncope.  All other systems reviewed and are negative.   Physical Exam Updated Vital Signs BP 96/72 (BP Location: Left Arm)   Pulse 99   Temp 98.4 F (36.9 C)   Resp 20   Ht 5\' 7"  (1.702 m)   Wt 59 kg   SpO2 96%   BMI 20.36 kg/m  Physical Exam Vitals and nursing note reviewed.  Constitutional:      General: She is not in acute distress.     Appearance: She is well-developed.  HENT:     Head: Normocephalic and atraumatic.  Eyes:     Conjunctiva/sclera: Conjunctivae normal.  Cardiovascular:     Rate and Rhythm: Normal rate and regular rhythm.     Heart sounds: No murmur heard. Pulmonary:     Effort: Pulmonary effort is normal. No respiratory distress.     Breath sounds: Normal breath sounds.  Abdominal:     General: There is no distension.     Palpations: Abdomen is soft.     Tenderness: There is no abdominal tenderness. There is no right CVA tenderness or left CVA tenderness.  Musculoskeletal:        General: Deformity (Substantial deformity to the left breast.  Erythema and induration to the left incision site.  Approximately 2 cm.  Large wound at this location.) present. No swelling or tenderness. Normal range of motion.     Cervical back: Neck supple.  Skin:    General: Skin is warm and dry.  Neurological:     General: No focal deficit present.     Mental Status: She is alert and oriented to person, place, and time. Mental status is at baseline.     Cranial Nerves: No cranial nerve deficit.      ED Course/ Medical Decision Making/ A&P    Procedures Procedures   Medications Ordered in ED Medications  cephALEXin (KEFLEX) capsule 500 mg (has no administration in time range)  doxycycline (VIBRA-TABS) tablet 100 mg (has no administration in time range)    MDM Patient's history of present illness and physical exam findings are most consistent with cellulitis. This is likely the etiology of the itching the patient was feeling initially. Now she has created an open wound and extracted her own breast implant. I informed her that this is a very high risk for progression of disease and she needs to follow-up with her breast surgeon immediately. Attach local resources if she is unable to establish with her normal breast surgeon.  Will treat her for cellulitis in the intermediate with Keflex and doxycycline for broad  coverage of skin flora. Discussed wet-to-dry dressing changes with the patient and management of her wound.  She needs to follow-up with her primary care doctor within 48 hours for reassessment of the wound and cellulitis and for coordination of her care. Patient ambulatory tolerating p.o. intake no acute distress feels comfortable with outpatient care and management.  Disposition:  I have considered need for hospitalization, however, considering all of the above, I believe this patient is stable for discharge at this time.  Patient/family educated about specific return precautions for given chief complaint and symptoms.  Patient/family educated about follow-up with PCP.     Patient/family expressed understanding of return precautions and need for follow-up. Patient spoken to regarding all imaging and laboratory results and appropriate follow up for these results. All education provided in verbal form with additional information in written form. Time was allowed for answering of patient questions. Patient discharged.    Emergency Department Medication Summary:   Medications  cephALEXin (KEFLEX) capsule 500 mg (has no administration in time range)  doxycycline (VIBRA-TABS) tablet 100 mg (has no administration in time range)        Clinical Impression:  1. Cellulitis, unspecified cellulitis site      Discharge   Final Clinical Impression(s) / ED Diagnoses Final diagnoses:  Cellulitis, unspecified cellulitis site    Rx / DC Orders ED Discharge Orders          Ordered    cephALEXin (KEFLEX) 500 MG capsule  4 times daily        03/07/24 2100    doxycycline (VIBRAMYCIN) 100 MG capsule  2 times daily        03/07/24 2100              Glyn Ade, MD 03/07/24 2101

## 2024-03-07 NOTE — ED Notes (Signed)
 Patient's SPO2 is 79 on room air upon arrival to triage.  Placed patient on 3 liter nasal cannula per her home regimen.  Patient's SPO2 is 94% on 3 liter nasal cannula.  RT will continue to monitor.

## 2024-03-07 NOTE — ED Notes (Signed)
 Offered pt to borrow O2 tank for ride home, pt declined. Stated has been without supplemental O2 for longer and would be okay.

## 2024-03-07 NOTE — ED Notes (Signed)
 Pt's uses oxygen at home and her current oxygen tank that's in the car is empty. Pt states that she only lives 10 mins away and has a full tank of oxygen at home.

## 2024-03-07 NOTE — ED Triage Notes (Addendum)
 Pt reports she is "leaking pus" out of a an abscess under her left breast, reports having implants placed 7 years ago and has a small area "that never healed correctly"  Pt on 3L [place] at home, ran out in the car looking for a place to evaluate the abscess, placed on 3L in triage; hx of Adv COPD, ShoB improving with O2, states ShoB at baseline
